# Patient Record
Sex: Male | Born: 1940 | Race: Black or African American | Hispanic: No | Smoking: Former smoker
Health system: Southern US, Community
[De-identification: ages and names within clinical notes are randomized; demographics above are authoritative.]

## PROBLEM LIST (undated history)

## (undated) DIAGNOSIS — I2699 Other pulmonary embolism without acute cor pulmonale: Secondary | ICD-10-CM

## (undated) DIAGNOSIS — J45909 Unspecified asthma, uncomplicated: Secondary | ICD-10-CM

## (undated) DIAGNOSIS — S62102A Fracture of unspecified carpal bone, left wrist, initial encounter for closed fracture: Secondary | ICD-10-CM

## (undated) DIAGNOSIS — J449 Chronic obstructive pulmonary disease, unspecified: Secondary | ICD-10-CM

## (undated) DIAGNOSIS — I219 Acute myocardial infarction, unspecified: Secondary | ICD-10-CM

## (undated) DIAGNOSIS — L0292 Furuncle, unspecified: Secondary | ICD-10-CM

## (undated) DIAGNOSIS — I10 Essential (primary) hypertension: Secondary | ICD-10-CM

## (undated) DIAGNOSIS — E119 Type 2 diabetes mellitus without complications: Secondary | ICD-10-CM

## (undated) HISTORY — PX: JOINT REPLACEMENT: SHX530

---

## 2012-11-14 DIAGNOSIS — F1190 Opioid use, unspecified, uncomplicated: Secondary | ICD-10-CM | POA: Insufficient documentation

## 2012-11-14 DIAGNOSIS — Z6841 Body Mass Index (BMI) 40.0 and over, adult: Secondary | ICD-10-CM | POA: Insufficient documentation

## 2012-11-14 DIAGNOSIS — I519 Heart disease, unspecified: Secondary | ICD-10-CM | POA: Insufficient documentation

## 2013-02-06 DIAGNOSIS — M79604 Pain in right leg: Secondary | ICD-10-CM | POA: Insufficient documentation

## 2013-03-21 DIAGNOSIS — D509 Iron deficiency anemia, unspecified: Secondary | ICD-10-CM | POA: Insufficient documentation

## 2014-04-03 DIAGNOSIS — I2699 Other pulmonary embolism without acute cor pulmonale: Secondary | ICD-10-CM

## 2014-04-03 DIAGNOSIS — I219 Acute myocardial infarction, unspecified: Secondary | ICD-10-CM

## 2014-04-03 HISTORY — DX: Other pulmonary embolism without acute cor pulmonale: I26.99

## 2014-04-03 HISTORY — DX: Acute myocardial infarction, unspecified: I21.9

## 2014-10-12 DIAGNOSIS — M5136 Other intervertebral disc degeneration, lumbar region: Secondary | ICD-10-CM | POA: Insufficient documentation

## 2014-10-12 DIAGNOSIS — M5416 Radiculopathy, lumbar region: Secondary | ICD-10-CM | POA: Insufficient documentation

## 2014-10-12 DIAGNOSIS — M4726 Other spondylosis with radiculopathy, lumbar region: Secondary | ICD-10-CM | POA: Insufficient documentation

## 2015-03-30 DIAGNOSIS — E119 Type 2 diabetes mellitus without complications: Secondary | ICD-10-CM | POA: Insufficient documentation

## 2015-04-04 DIAGNOSIS — S62102A Fracture of unspecified carpal bone, left wrist, initial encounter for closed fracture: Secondary | ICD-10-CM

## 2015-04-04 HISTORY — DX: Fracture of unspecified carpal bone, left wrist, initial encounter for closed fracture: S62.102A

## 2015-04-05 DIAGNOSIS — S52515A Nondisplaced fracture of left radial styloid process, initial encounter for closed fracture: Secondary | ICD-10-CM | POA: Insufficient documentation

## 2015-04-23 DIAGNOSIS — Z7901 Long term (current) use of anticoagulants: Secondary | ICD-10-CM | POA: Insufficient documentation

## 2015-05-10 DIAGNOSIS — R748 Abnormal levels of other serum enzymes: Secondary | ICD-10-CM | POA: Insufficient documentation

## 2015-05-10 DIAGNOSIS — R110 Nausea: Secondary | ICD-10-CM | POA: Insufficient documentation

## 2015-05-10 DIAGNOSIS — R1013 Epigastric pain: Secondary | ICD-10-CM | POA: Insufficient documentation

## 2015-05-10 DIAGNOSIS — R197 Diarrhea, unspecified: Secondary | ICD-10-CM | POA: Insufficient documentation

## 2015-05-10 DIAGNOSIS — Z86711 Personal history of pulmonary embolism: Secondary | ICD-10-CM | POA: Insufficient documentation

## 2015-05-10 DIAGNOSIS — E876 Hypokalemia: Secondary | ICD-10-CM | POA: Insufficient documentation

## 2015-05-10 DIAGNOSIS — I10 Essential (primary) hypertension: Secondary | ICD-10-CM | POA: Insufficient documentation

## 2015-05-16 DIAGNOSIS — G894 Chronic pain syndrome: Secondary | ICD-10-CM | POA: Insufficient documentation

## 2015-05-27 DIAGNOSIS — S52502D Unspecified fracture of the lower end of left radius, subsequent encounter for closed fracture with routine healing: Secondary | ICD-10-CM | POA: Insufficient documentation

## 2015-05-27 DIAGNOSIS — M19042 Primary osteoarthritis, left hand: Secondary | ICD-10-CM | POA: Insufficient documentation

## 2015-06-20 DIAGNOSIS — G4700 Insomnia, unspecified: Secondary | ICD-10-CM | POA: Insufficient documentation

## 2015-09-18 ENCOUNTER — Emergency Department (HOSPITAL_BASED_OUTPATIENT_CLINIC_OR_DEPARTMENT_OTHER)
Admission: EM | Admit: 2015-09-18 | Discharge: 2015-09-18 | Disposition: A | Discharge status: Home / Self Care | Payer: Medicare Other | Attending: Emergency Medicine | Admitting: Emergency Medicine

## 2015-09-18 ENCOUNTER — Emergency Department (HOSPITAL_BASED_OUTPATIENT_CLINIC_OR_DEPARTMENT_OTHER): Payer: Medicare Other

## 2015-09-18 ENCOUNTER — Encounter (HOSPITAL_BASED_OUTPATIENT_CLINIC_OR_DEPARTMENT_OTHER): Payer: Self-pay | Admitting: *Deleted

## 2015-09-18 DIAGNOSIS — S63619A Unspecified sprain of unspecified finger, initial encounter: Secondary | ICD-10-CM | POA: Insufficient documentation

## 2015-09-18 DIAGNOSIS — E119 Type 2 diabetes mellitus without complications: Secondary | ICD-10-CM | POA: Insufficient documentation

## 2015-09-18 DIAGNOSIS — S60032A Contusion of left middle finger without damage to nail, initial encounter: Secondary | ICD-10-CM | POA: Insufficient documentation

## 2015-09-18 DIAGNOSIS — J449 Chronic obstructive pulmonary disease, unspecified: Secondary | ICD-10-CM | POA: Diagnosis not present

## 2015-09-18 DIAGNOSIS — Z87891 Personal history of nicotine dependence: Secondary | ICD-10-CM | POA: Insufficient documentation

## 2015-09-18 DIAGNOSIS — W1830XA Fall on same level, unspecified, initial encounter: Secondary | ICD-10-CM | POA: Insufficient documentation

## 2015-09-18 DIAGNOSIS — T07XXXA Unspecified multiple injuries, initial encounter: Secondary | ICD-10-CM

## 2015-09-18 DIAGNOSIS — Y92009 Unspecified place in unspecified non-institutional (private) residence as the place of occurrence of the external cause: Secondary | ICD-10-CM | POA: Diagnosis not present

## 2015-09-18 DIAGNOSIS — I10 Essential (primary) hypertension: Secondary | ICD-10-CM | POA: Diagnosis not present

## 2015-09-18 DIAGNOSIS — I252 Old myocardial infarction: Secondary | ICD-10-CM | POA: Diagnosis not present

## 2015-09-18 DIAGNOSIS — S62501A Fracture of unspecified phalanx of right thumb, initial encounter for closed fracture: Secondary | ICD-10-CM

## 2015-09-18 DIAGNOSIS — Y999 Unspecified external cause status: Secondary | ICD-10-CM | POA: Diagnosis not present

## 2015-09-18 DIAGNOSIS — S7002XA Contusion of left hip, initial encounter: Secondary | ICD-10-CM | POA: Diagnosis not present

## 2015-09-18 DIAGNOSIS — S62524A Nondisplaced fracture of distal phalanx of right thumb, initial encounter for closed fracture: Secondary | ICD-10-CM | POA: Diagnosis not present

## 2015-09-18 DIAGNOSIS — Y9301 Activity, walking, marching and hiking: Secondary | ICD-10-CM | POA: Diagnosis not present

## 2015-09-18 DIAGNOSIS — S0990XA Unspecified injury of head, initial encounter: Secondary | ICD-10-CM

## 2015-09-18 HISTORY — DX: Unspecified asthma, uncomplicated: J45.909

## 2015-09-18 HISTORY — DX: Fracture of unspecified carpal bone, left wrist, initial encounter for closed fracture: S62.102A

## 2015-09-18 HISTORY — DX: Type 2 diabetes mellitus without complications: E11.9

## 2015-09-18 HISTORY — DX: Furuncle, unspecified: L02.92

## 2015-09-18 HISTORY — DX: Essential (primary) hypertension: I10

## 2015-09-18 HISTORY — DX: Other pulmonary embolism without acute cor pulmonale: I26.99

## 2015-09-18 HISTORY — DX: Chronic obstructive pulmonary disease, unspecified: J44.9

## 2015-09-18 HISTORY — DX: Acute myocardial infarction, unspecified: I21.9

## 2015-09-18 LAB — CBC WITH DIFFERENTIAL/PLATELET
BASOS ABS: 0 10*3/uL (ref 0.0–0.1)
BASOS PCT: 1 %
EOS ABS: 0.1 10*3/uL (ref 0.0–0.7)
EOS PCT: 2 %
HCT: 37.9 % — ABNORMAL LOW (ref 39.0–52.0)
Hemoglobin: 12.7 g/dL — ABNORMAL LOW (ref 13.0–17.0)
Lymphocytes Relative: 27 %
Lymphs Abs: 2 10*3/uL (ref 0.7–4.0)
MCH: 29 pg (ref 26.0–34.0)
MCHC: 33.5 g/dL (ref 30.0–36.0)
MCV: 86.5 fL (ref 78.0–100.0)
MONO ABS: 0.9 10*3/uL (ref 0.1–1.0)
Monocytes Relative: 12 %
Neutro Abs: 4.5 10*3/uL (ref 1.7–7.7)
Neutrophils Relative %: 58 %
PLATELETS: 242 10*3/uL (ref 150–400)
RBC: 4.38 MIL/uL (ref 4.22–5.81)
RDW: 14.8 % (ref 11.5–15.5)
WBC: 7.7 10*3/uL (ref 4.0–10.5)

## 2015-09-18 LAB — BASIC METABOLIC PANEL
Anion gap: 8 (ref 5–15)
BUN: 20 mg/dL (ref 6–20)
CALCIUM: 8.8 mg/dL — AB (ref 8.9–10.3)
CO2: 25 mmol/L (ref 22–32)
CREATININE: 1.21 mg/dL (ref 0.61–1.24)
Chloride: 103 mmol/L (ref 101–111)
GFR calc Af Amer: >60 mL/min (ref >60)
GFR, EST NON AFRICAN AMERICAN: 56 mL/min — AB (ref >60)
GLUCOSE: 175 mg/dL — AB (ref 65–99)
POTASSIUM: 3.6 mmol/L (ref 3.5–5.1)
SODIUM: 136 mmol/L (ref 135–145)

## 2015-09-18 LAB — PROTIME-INR
INR: 2.18 — AB (ref 0.00–1.49)
PROTHROMBIN TIME: 24.1 s — AB (ref 11.6–15.2)

## 2015-09-18 MED ORDER — OXYCODONE-ACETAMINOPHEN 5-325 MG PO TABS: 1.0000 | ORAL_TABLET | ORAL | Status: DC | PRN

## 2015-09-18 NOTE — Discharge Instructions (Signed)
Thumb Fracture A thumb fracture is a break in one of the two bones of your thumb. The thumb bone that goes from the tip of your thumb to the first joint in your thumb is called the distal phalanx. The thumb bone that goes from the first joint to the joint at the base of your thumb is called the proximal phalanx. Breaks that occur at the joints of your thumb are harder to treat. A broken thumb is more serious than a break in one of your other fingers because you need your thumb for grasping. Thumb fractures are also more likely to lead to pain and stiffness years after healing (arthritis). CAUSES Thumb fractures may be caused by:  A direct blow to your thumb.  Stress on your thumb from it being pulled out of place. These types of injuries often happen as a result of:  Car accidents.  Bicycle accidents.  Falling with your hand outstretched.  Participating in sports such as wrestling, hockey, football, or skiing. RISK FACTORS You may be more likely to break your thumb if you have a condition that causes your bones to become thin and brittle (osteoporosis). SIGNS AND SYMPTOMS The most common symptom is severe pain at the fracture site. Other signs and symptoms may include:  Swelling.  Bruising.  Not being able to move the thumb.  An abnormal shape of the thumb (deformity).  Numbness or coldness.  A red, black, or blue thumbnail. DIAGNOSIS Your health care provider may suspect a thumb fracture if you recently injured your thumb and have signs and symptoms of a fracture. An X-ray of your thumb may be done to confirm the diagnosis and determine how bad the break is. TREATMENT A thumb fracture should be treated as soon as possible. You may need to wear a padded splint to keep your thumb from moving and to protect your thumb until you can get a cast or have surgery. Treatment options include:  Immobilization.  A cast or splint is put on the injured area without changing the position  of the broken bone.  You may have to wear a type of cast called a spica cast or hitchhiker cast to hold the thumb in the proper position.  A cast is usually left on for 4-6 weeks.  Closed reduction.  In this procedure, the bones are put back into position without surgery.  Open reduction and internal fixation (ORIF). This is a surgical procedure.  First, the fracture site is opened up.  Then, the bone pieces are fixed into place with metal screws, plates, or wires.  External fixation.  In this type of open reduction, the fracture is held in place by metal pins.  The pins are attached to a stabilizing bar outside your skin.  You may need to wear a cast after surgery for up to 6 weeks.  You may need to return for X-rays to make sure your thumb is healing properly.  After your cast is taken off, you may need to do hand exercises (physical therapy) to get movement back in your thumb.  It may take another 3 months to regain complete use of your thumb. HOME CARE INSTRUCTIONS  Take medicines only as directed by your health care provider.  Keep your hand elevated above the level of your heart when resting.  Keep your cast dry when bathing. Cover it with a plastic bag as directed by your health care provider.  After your cast is removed, exercise your thumb at home.  Your health care provider may suggest that you:  Move your thumb in circles.  Touch your thumb to your little finger.  Do these exercises several times a day.  Ask your health care provider whether you can use a hand exerciser to strengthen your muscles.  If your thumb feels stiff while you are exercising it, try doing the exercises while soaking your hand in warm water.  Keep all follow-up visits as directed by your health care provider. This is important. SEEK MEDICAL CARE IF:  You have more than a small spot of bleeding from under your cast or splint.  Your pain medicine is not helping.  You have a  fever.  You have numbness or tingling in the injured area.  Your cast becomes loose or damaged.  You notice a bad odor or discharge coming from under your cast. SEEK IMMEDIATE MEDICAL CARE IF:  You have pain that is very bad or getting worse.  You lose feeling in your thumb.  Your thumb turns pale or blue.  Your thumb feels cold.  You have drainage, redness, or swelling at the injury site. MAKE SURE YOU:  Understand these instructions.  Will watch your condition.  Will get help right away if you are not doing well or get worse.   This information is not intended to replace advice given to you by your health care provider. Make sure you discuss any questions you have with your health care provider.   Document Released: 12/17/2002 Document Revised: 12/09/2014 Document Reviewed: 05/23/2013 Elsevier Interactive Patient Education 2016 [place] Injury, Adult You have a head injury. Headaches and throwing up (vomiting) are common after a head injury. It should be easy to wake up from sleeping. Sometimes you must stay in the hospital. Most problems happen within the first 24 hours. Side effects may occur up to 7-10 days after the injury.  WHAT ARE THE TYPES OF HEAD INJURIES? Head injuries can be as minor as a bump. Some head injuries can be more severe. More severe head injuries include:  A jarring injury to the brain (concussion).  A bruise of the brain (contusion). This mean there is bleeding in the brain that can cause swelling.  A cracked skull (skull fracture).  Bleeding in the brain that collects, clots, and forms a bump (hematoma). WHEN SHOULD I GET HELP RIGHT AWAY?   You are confused or sleepy.  You cannot be woken up.  You feel sick to your stomach (nauseous) or keep throwing up (vomiting).  Your dizziness or unsteadiness is getting worse.  You have very bad, lasting headaches that are not helped by medicine. Take medicines only as told by your  doctor.  You cannot use your arms or legs like normal.  You cannot walk.  You notice changes in the black spots in the center of the colored part of your eye (pupil).  You have clear or bloody fluid coming from your nose or ears.  You have trouble seeing. During the next 24 hours after the injury, you must stay with someone who can watch you. This person should get help right away (call 911 in the [place] if you start to shake and are not able to control it (have seizures), you pass out, or you are unable to wake up. HOW CAN I PREVENT A HEAD INJURY IN THE FUTURE?  Wear seat belts.  Wear a helmet while bike riding and playing sports like football.  Stay away from dangerous activities around the  house. WHEN CAN I RETURN TO NORMAL ACTIVITIES AND ATHLETICS? See your doctor before doing these activities. You should not do normal activities or play contact sports until 1 week after the following symptoms have stopped:  Headache that does not go away.  Dizziness.  Poor attention.  Confusion.  Memory problems.  Sickness to your stomach or throwing up.  Tiredness.  Fussiness.  Bothered by bright lights or loud noises.  Anxiousness or depression.  Restless sleep. MAKE SURE YOU:   Understand these instructions.  Will watch your condition.  Will get help right away if you are not doing well or get worse.   This information is not intended to replace advice given to you by your health care provider. Make sure you discuss any questions you have with your health care provider.   Document Released: 03/02/2008 Document Revised: 04/10/2014 Document Reviewed: 11/25/2012 Elsevier Interactive Patient Education 2016 [employer]  Finger Sprain A finger sprain is a tear in one of the strong, fibrous tissues that connect the bones (ligaments) in your finger. The severity of the sprain depends on how much of the ligament is torn. The tear can be either partial or complete. CAUSES   Often, sprains are a result of a fall or accident. If you extend your hands to catch an object or to protect yourself, the force of the impact causes the fibers of your ligament to stretch too much. This excess tension causes the fibers of your ligament to tear. SYMPTOMS  You may have some loss of motion in your finger. Other symptoms include:  Bruising.  Tenderness.  Swelling. DIAGNOSIS  In order to diagnose finger sprain, your caregiver will physically examine your finger or thumb to determine how torn the ligament is. Your caregiver may also suggest an X-ray exam of your finger to make sure no bones are broken. TREATMENT  If your ligament is only partially torn, treatment usually involves keeping the finger in a fixed position (immobilization) for a short period. To do this, your caregiver will apply a bandage, cast, or splint to keep your finger from moving until it heals. For a partially torn ligament, the healing process usually takes 2 to 3 weeks. If your ligament is completely torn, you may need surgery to reconnect the ligament to the bone. After surgery a cast or splint will be applied and will need to stay on your finger or thumb for 4 to 6 weeks while your ligament heals. HOME CARE INSTRUCTIONS  Keep your injured finger elevated, when possible, to decrease swelling.  To ease pain and swelling, apply ice to your joint twice a day, for 2 to 3 days:  Put ice in a plastic bag.  Place a towel between your skin and the bag.  Leave the ice on for 15 minutes.  Only take over-the-counter or prescription medicine for pain as directed by your caregiver.  Do not wear rings on your injured finger.  Do not leave your finger unprotected until pain and stiffness go away (usually 3 to 4 weeks).  Do not allow your cast or splint to get wet. Cover your cast or splint with a plastic bag when you shower or bathe. Do not swim.  Your caregiver may suggest special exercises for you to do  during your recovery to prevent or limit permanent stiffness. SEEK IMMEDIATE MEDICAL CARE IF:  Your cast or splint becomes damaged.  Your pain becomes worse rather than better. MAKE SURE YOU:  Understand these instructions.  Will watch  your condition.  Will get help right away if you are not doing well or get worse.   This information is not intended to replace advice given to you by your health care provider. Make sure you discuss any questions you have with your health care provider.   Document Released: 04/27/2004 Document Revised: 04/10/2014 Document Reviewed: 11/21/2010 Elsevier Interactive Patient Education [employer].

## 2015-09-18 NOTE — ED Provider Notes (Signed)
CSN: [id number]     Arrival date & time 09/18/15  [id number] History   First MD Initiated Contact with Patient 09/18/15 [phone]     Chief Complaint  Patient presents with  . Fall     (Consider location/radiation/quality/duration/timing/severity/associated sxs/prior Treatment) HPI Comments: Patient history diabetes, hypertension, COPD and prior PE on Coumadin presents after a fall. He states about 10-11 last night he was walking in the hallway of his apartment in his right knee gave out. He states from time to time his knee gives out and when it gave out last night caused him to fall. He fell over onto his left hip and braced himself with his hands. He did hit his head on the wall. There is no loss of consciousness. He complains of pain to his low back although he has chronic pain to his low back. He also has pain to his left hip and both of his hands. He denies any nausea or vomiting. He has chronic pain to right leg which is unchanged from baseline. 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 He denies any recent illnesses. He denies any dizziness chest pain shortness of breath or other symptoms preceding the fall.  Patient is a 75 y.o. male presenting with fall.  Fall Pertinent negatives include no chest pain, no abdominal pain, no headaches and no shortness of breath.    Past Medical History  Diagnosis Date  . Pulmonary embolism ([place]) 2016  . Diabetes mellitus without complication ([place])   . Hypertension   . COPD (chronic obstructive pulmonary disease) ([place])   . Asthma   . Boils   . Myocardial infarction ([place]) 2016  . Wrist fracture, left 2017   Past Surgical History  Procedure Laterality Date  . Joint replacement     No family history on file. Social History  Substance Use Topics  . Smoking status: Former Research scientist (life sciences)  . Smokeless tobacco: Never Used  . Alcohol Use: 1.2 oz/week    2 Cans of beer per week    Review of Systems  Constitutional: Negative for fever, chills, diaphoresis and  fatigue.  HENT: Negative for congestion, rhinorrhea and sneezing.   Eyes: Negative.   Respiratory: Negative for cough, chest tightness and shortness of breath.   Cardiovascular: Negative for chest pain and leg swelling.  Gastrointestinal: Negative for nausea, vomiting, abdominal pain, diarrhea and blood in stool.  Genitourinary: Negative for frequency, hematuria, flank pain and difficulty urinating.  Musculoskeletal: Positive for back pain and arthralgias.  Skin: Negative for rash and wound.  Neurological: Negative for dizziness, speech difficulty, weakness, numbness and headaches.      Allergies  Adhesive  Home Medications   Prior to Admission medications   Medication Sig Start Date End Date Taking? Authorizing Provider  oxyCODONE-acetaminophen (PERCOCET) 5-325 MG tablet Take 1-2 tablets by mouth every 4 (four) hours as needed. 09/18/15   Malvin Johns, MD   BP 152/80 mmHg  Pulse 99  Temp(Src) 98.6 F (37 C) (Oral)  Resp 20  Ht 5\' 9"  (1.753 m)  Wt 250 lb (113.399 kg)  BMI 36.90 kg/m2  SpO2 98% Physical Exam  Constitutional: He is oriented to person, place, and time. He appears well-developed and well-nourished.  HENT:  Head: Normocephalic and atraumatic.  Eyes: Pupils are equal, round, and reactive to light.  Neck:  No tenderness to the cervical or thoracic spine. There is tenderness to the lower lumbar spine. No step-offs or deformities are noted.  Cardiovascular: Normal rate, regular rhythm and normal heart sounds.  Pulmonary/Chest: Effort normal and breath sounds normal. No respiratory distress. He has no wheezes. He has no rales. He exhibits no tenderness.  Abdominal: Soft. Bowel sounds are normal. There is no tenderness. There is no rebound and no guarding.  Musculoskeletal: Normal range of motion. He exhibits no edema.  Patient has pain on range of motion the left hip. There is no noticeable deformity to the leg. He has prior bilateral midline scars over the anterior  knees. He has no pain to the knee. There is some tenderness to the mid left tibia. There some mild swelling to this area. He reports that this is from an injury about a month ago. There is some generalized tenderness to the right leg but he states this is chronic and unchanged from his baseline. Pedal pulses are faint but noted by Doppler bilaterally. There is no color change or indication of acute arterial compromise. He has pain to his left hand, primarily over the third MCP joint with swelling to this area. There is also tenderness to the radial side of the left wrist. There is pain to the right thumb over the proximal phalanx. There is no other pain to the remainder of the hand or wrist. No pain to the shoulders or elbows. No wounds are noted.  Lymphadenopathy:    He has no cervical adenopathy.  Neurological: He is alert and oriented to person, place, and time.  Skin: Skin is warm and dry. No rash noted.  Psychiatric: He has a normal mood and affect.    ED Course  Procedures (including critical care time) Labs Review Labs Reviewed  PROTIME-INR - Abnormal; Notable for the following:    Prothrombin Time 24.1 (*)    INR 2.18 (*)    All other components within normal limits  BASIC METABOLIC PANEL - Abnormal; Notable for the following:    Glucose, Bld 175 (*)    Calcium 8.8 (*)    GFR calc non Af Amer 56 (*)    All other components within normal limits  CBC WITH DIFFERENTIAL/PLATELET - Abnormal; Notable for the following:    Hemoglobin 12.7 (*)    HCT 37.9 (*)    All other components within normal limits   Results for orders placed or performed during the hospital encounter of 09/18/15  Protime-INR  Result Value Ref Range   Prothrombin Time 24.1 (H) 11.6 - 15.2 seconds   INR 2.18 (H) 0.00 - 99991111  Basic metabolic panel  Result Value Ref Range   Sodium 136 135 - 145 mmol/L   Potassium 3.6 3.5 - 5.1 mmol/L   Chloride 103 101 - 111 mmol/L   CO2 25 22 - 32 mmol/L   Glucose, Bld 175 (H)  65 - 99 mg/dL   BUN 20 6 - 20 mg/dL   Creatinine, Ser 1.21 0.61 - 1.24 mg/dL   Calcium 8.8 (L) 8.9 - 10.3 mg/dL   GFR calc non Af Amer 56 (L) >60 mL/min   GFR calc Af Amer >60 >60 mL/min   Anion gap 8 5 - 15  CBC with Differential  Result Value Ref Range   WBC 7.7 4.0 - 10.5 K/uL   RBC 4.38 4.22 - 5.81 MIL/uL   Hemoglobin 12.7 (L) 13.0 - 17.0 g/dL   HCT 37.9 (L) 39.0 - 52.0 %   MCV 86.5 78.0 - 100.0 fL   MCH 29.0 26.0 - 34.0 pg   MCHC 33.5 30.0 - 36.0 g/dL   RDW 14.8 11.5 - 15.5 %  Platelets 242 150 - 400 K/uL   Neutrophils Relative % 58 %   Neutro Abs 4.5 1.7 - 7.7 K/uL   Lymphocytes Relative 27 %   Lymphs Abs 2.0 0.7 - 4.0 K/uL   Monocytes Relative 12 %   Monocytes Absolute 0.9 0.1 - 1.0 K/uL   Eosinophils Relative 2 %   Eosinophils Absolute 0.1 0.0 - 0.7 K/uL   Basophils Relative 1 %   Basophils Absolute 0.0 0.0 - 0.1 K/uL   Dg Lumbar Spine Complete  09/18/2015  CLINICAL DATA:  Fall.  Low back pain. EXAM: LUMBAR SPINE - COMPLETE 4+ VIEW COMPARISON:  None. FINDINGS: This report assumes 5 non rib-bearing lumbar vertebrae. Lumbar vertebral body heights are preserved, with no fracture. Moderate spondylosis throughout the lumbar spine with loss of disc height at L5-S1. No spondylolisthesis. Facet arthropathy bilaterally in the lower lumbar spine. No aggressive appearing focal osseous lesions. IMPRESSION: 1. No lumbar spine fracture or spondylolisthesis . 2. Moderate spondylosis. Electronically Signed   By: Ilona Sorrel M.D.   On: 09/18/2015 09:44   Dg Wrist Complete Left  09/18/2015  CLINICAL DATA:  Fall.  Left wrist pain. EXAM: LEFT WRIST - COMPLETE 3+ VIEW COMPARISON:  None. FINDINGS: No fracture, dislocation or suspicious focal osseous lesion. Mild-to-moderate osteoarthritis at the radiocarpal joint. Severe osteoarthritis at the first carpometacarpal joint. Diffuse left wrist soft tissue swelling. IMPRESSION: No fracture or dislocation. Diffuse left wrist soft tissue swelling with  advanced osteoarthritis as described. Electronically Signed   By: Ilona Sorrel M.D.   On: 09/18/2015 09:35   Dg Tibia/fibula Left  09/18/2015  CLINICAL DATA:  Fall.  Left lower extremity pain. EXAM: LEFT TIBIA AND FIBULA - 2 VIEW COMPARISON:  None. FINDINGS: Intra medullary rod in the distal left femur traverses the left knee joint and extends into the distal left tibial shaft with distal interlocking screw. No evidence of hardware fracture or loosening. Ankylosis of the left knee joint. No osseous fracture or suspicious focal osseous lesion. No evidence of malalignment at the left ankle on the provided views. IMPRESSION: No osseous fracture. 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 Intramedullary rod traverses the left femoral and left tibial shafts, with no evidence of hardware complication. Electronically Signed   By: Ilona Sorrel M.D.   On: 09/18/2015 09:39   Ct Head Wo Contrast  09/18/2015  CLINICAL DATA:  Fall at home last night.  No loss of consciousness. EXAM: CT HEAD WITHOUT CONTRAST CT CERVICAL SPINE WITHOUT CONTRAST TECHNIQUE: Multidetector CT imaging of the head and cervical spine was performed following the standard protocol without intravenous contrast. Multiplanar CT image reconstructions of the cervical spine were also generated. COMPARISON:  None. FINDINGS: CT HEAD FINDINGS Bony calvarium appears intact. Mild diffuse cortical atrophy is noted. Minimal chronic ischemic white matter disease is noted. No mass effect or midline shift is noted. Ventricular size is within normal limits. There is no evidence of mass lesion, hemorrhage or acute infarction. CT CERVICAL SPINE FINDINGS No fracture or spondylolisthesis is noted. Moderate degenerative disc disease is noted at C2-3, C3-4 and C4-5 with anterior osteophyte formation. Mild degenerative disc disease is noted at C5-6. Severe degenerative disc disease is noted at C6-7. Degenerative changes seen involving the right posterior facet joint at C4-5.  Visualized lung fields are unremarkable. IMPRESSION: Mild diffuse cortical atrophy. Mild chronic ischemic white matter disease. No acute intracranial abnormality seen. 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 No fracture or spondylolisthesis is noted. Electronically Signed  By: Marijo Conception, M.D.   On: 09/18/2015 08:43   Ct Cervical Spine Wo Contrast  09/18/2015  CLINICAL DATA:  Fall at home last night.  No loss of consciousness. EXAM: CT HEAD WITHOUT CONTRAST CT CERVICAL SPINE WITHOUT CONTRAST TECHNIQUE: Multidetector CT imaging of the head and cervical spine was performed following the standard protocol without intravenous contrast. Multiplanar CT image reconstructions of the cervical spine were also generated. COMPARISON:  None. FINDINGS: CT HEAD FINDINGS Bony calvarium appears intact. Mild diffuse cortical atrophy is noted. Minimal chronic ischemic white matter disease is noted. No mass effect or midline shift is noted. Ventricular size is within normal limits. There is no evidence of mass lesion, hemorrhage or acute infarction. CT CERVICAL SPINE FINDINGS No fracture or spondylolisthesis is noted. Moderate degenerative disc disease is noted at C2-3, C3-4 and C4-5 with anterior osteophyte formation. Mild degenerative disc disease is noted at C5-6. Severe degenerative disc disease is noted at C6-7. Degenerative changes seen involving the right posterior facet joint at C4-5. Visualized lung fields are unremarkable. IMPRESSION: Mild diffuse cortical atrophy. Mild chronic ischemic white matter disease. No acute intracranial abnormality seen. 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 No fracture or spondylolisthesis is noted. Electronically Signed   By: Marijo Conception, M.D.   On: 09/18/2015 08:43   Dg Hand Complete Left  09/18/2015  CLINICAL DATA:  Acute left hand pain after fall last night. EXAM: LEFT HAND - COMPLETE 3+ VIEW COMPARISON:  None.  FINDINGS: There is no evidence of fracture or dislocation. Narrowing and osteophyte formation is seen involving the first carpometacarpal joint. Narrowing of the radiocarpal joint is noted as well. Soft tissues are unremarkable. IMPRESSION: Osteoarthritis of the first carpometacarpal joint. No acute abnormality seen in the left hand. Electronically Signed   By: Marijo Conception, M.D.   On: 09/18/2015 09:35   Dg Finger Thumb Right  09/18/2015  CLINICAL DATA:  Fall.  Right thumb pain. EXAM: RIGHT THUMB 2+V COMPARISON:  None. FINDINGS: Intra-articular nondisplaced volar plate fracture at the distal phalangeal side of the interphalangeal joint in the right thumb. No additional fracture. No dislocation. No suspicious focal osseous lesion. Polyarticular mild-to-moderate osteoarthritis, most prominent at the first carpometacarpal joint. IMPRESSION: Nondisplaced intra-articular volar plate fracture at the distal phalangeal side of the interphalangeal joint in the right thumb. Electronically Signed   By: Ilona Sorrel M.D.   On: 09/18/2015 09:41   Dg Hip Unilat With Pelvis 2-3 Views Left  09/18/2015  CLINICAL DATA:  Fall.  Left hip pain. EXAM: DG HIP (WITH OR WITHOUT PELVIS) 2-3V LEFT COMPARISON:  None. FINDINGS: No pelvic fracture or diastasis. No left hip fracture or dislocation. Partially visualized intra medullary rod in the left proximal femur with interlocking proximal screw and prominent heterotopic calcification at the left greater trochanter. No evidence of hardware fracture or loosening. Bilateral hip osteoarthritis, moderate on the right and mild on the left. No suspicious focal osseous lesion. Degenerative changes in the visualized lower lumbar spine. IMPRESSION: No left hip fracture or dislocation. Partially visualized hardware in the left proximal femur, with no hardware complication. Heterotopic calcification at the left greater trochanter. Bilateral hip osteoarthritis. Electronically Signed   By: Ilona Sorrel M.D.   On: 09/18/2015 09:37      Imaging Review Dg Lumbar Spine Complete  09/18/2015  CLINICAL DATA:  Fall.  Low back pain. EXAM: LUMBAR SPINE - COMPLETE 4+ VIEW COMPARISON:  None. FINDINGS: This report assumes 5 non  rib-bearing lumbar vertebrae. Lumbar vertebral body heights are preserved, with no fracture. Moderate spondylosis throughout the lumbar spine with loss of disc height at L5-S1. No spondylolisthesis. Facet arthropathy bilaterally in the lower lumbar spine. No aggressive appearing focal osseous lesions. IMPRESSION: 1. No lumbar spine fracture or spondylolisthesis . 2. Moderate spondylosis. Electronically Signed   By: Ilona Sorrel M.D.   On: 09/18/2015 09:44   Dg Wrist Complete Left  09/18/2015  CLINICAL DATA:  Fall.  Left wrist pain. EXAM: LEFT WRIST - COMPLETE 3+ VIEW COMPARISON:  None. FINDINGS: No fracture, dislocation or suspicious focal osseous lesion. Mild-to-moderate osteoarthritis at the radiocarpal joint. Severe osteoarthritis at the first carpometacarpal joint. Diffuse left wrist soft tissue swelling. IMPRESSION: No fracture or dislocation. Diffuse left wrist soft tissue swelling with advanced osteoarthritis as described. Electronically Signed   By: Ilona Sorrel M.D.   On: 09/18/2015 09:35   Dg Tibia/fibula Left  09/18/2015  CLINICAL DATA:  Fall.  Left lower extremity pain. EXAM: LEFT TIBIA AND FIBULA - 2 VIEW COMPARISON:  None. FINDINGS: Intra medullary rod in the distal left femur traverses the left knee joint and extends into the distal left tibial shaft with distal interlocking screw. No evidence of hardware fracture or loosening. Ankylosis of the left knee joint. No osseous fracture or suspicious focal osseous lesion. No evidence of malalignment at the left ankle on the provided views. IMPRESSION: No osseous fracture. 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 Intramedullary rod traverses the left femoral and left tibial shafts, with no evidence of hardware complication.  Electronically Signed   By: Ilona Sorrel M.D.   On: 09/18/2015 09:39   Ct Head Wo Contrast  09/18/2015  CLINICAL DATA:  Fall at home last night.  No loss of consciousness. EXAM: CT HEAD WITHOUT CONTRAST CT CERVICAL SPINE WITHOUT CONTRAST TECHNIQUE: Multidetector CT imaging of the head and cervical spine was performed following the standard protocol without intravenous contrast. Multiplanar CT image reconstructions of the cervical spine were also generated. COMPARISON:  None. FINDINGS: CT HEAD FINDINGS Bony calvarium appears intact. Mild diffuse cortical atrophy is noted. Minimal chronic ischemic white matter disease is noted. No mass effect or midline shift is noted. Ventricular size is within normal limits. There is no evidence of mass lesion, hemorrhage or acute infarction. CT CERVICAL SPINE FINDINGS No fracture or spondylolisthesis is noted. Moderate degenerative disc disease is noted at C2-3, C3-4 and C4-5 with anterior osteophyte formation. Mild degenerative disc disease is noted at C5-6. Severe degenerative disc disease is noted at C6-7. Degenerative changes seen involving the right posterior facet joint at C4-5. Visualized lung fields are unremarkable. IMPRESSION: Mild diffuse cortical atrophy. Mild chronic ischemic white matter disease. No acute intracranial abnormality seen. 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 No fracture or spondylolisthesis is noted. Electronically Signed   By: Marijo Conception, M.D.   On: 09/18/2015 08:43   Ct Cervical Spine Wo Contrast  09/18/2015  CLINICAL DATA:  Fall at home last night.  No loss of consciousness. EXAM: CT HEAD WITHOUT CONTRAST CT CERVICAL SPINE WITHOUT CONTRAST TECHNIQUE: Multidetector CT imaging of the head and cervical spine was performed following the standard protocol without intravenous contrast. Multiplanar CT image reconstructions of the cervical spine were also generated. COMPARISON:  None. FINDINGS: CT HEAD FINDINGS Bony  calvarium appears intact. Mild diffuse cortical atrophy is noted. Minimal chronic ischemic white matter disease is noted. No mass effect or midline shift is noted. Ventricular size is within normal limits.  There is no evidence of mass lesion, hemorrhage or acute infarction. CT CERVICAL SPINE FINDINGS No fracture or spondylolisthesis is noted. Moderate degenerative disc disease is noted at C2-3, C3-4 and C4-5 with anterior osteophyte formation. Mild degenerative disc disease is noted at C5-6. Severe degenerative disc disease is noted at C6-7. Degenerative changes seen involving the right posterior facet joint at C4-5. Visualized lung fields are unremarkable. IMPRESSION: Mild diffuse cortical atrophy. Mild chronic ischemic white matter disease. No acute intracranial abnormality seen. 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 No fracture or spondylolisthesis is noted. Electronically Signed   By: Marijo Conception, M.D.   On: 09/18/2015 08:43   Dg Hand Complete Left  09/18/2015  CLINICAL DATA:  Acute left hand pain after fall last night. EXAM: LEFT HAND - COMPLETE 3+ VIEW COMPARISON:  None. FINDINGS: There is no evidence of fracture or dislocation. Narrowing and osteophyte formation is seen involving the first carpometacarpal joint. Narrowing of the radiocarpal joint is noted as well. Soft tissues are unremarkable. IMPRESSION: Osteoarthritis of the first carpometacarpal joint. No acute abnormality seen in the left hand. Electronically Signed   By: Marijo Conception, M.D.   On: 09/18/2015 09:35   Dg Finger Thumb Right  09/18/2015  CLINICAL DATA:  Fall.  Right thumb pain. EXAM: RIGHT THUMB 2+V COMPARISON:  None. FINDINGS: Intra-articular nondisplaced volar plate fracture at the distal phalangeal side of the interphalangeal joint in the right thumb. No additional fracture. No dislocation. No suspicious focal osseous lesion. Polyarticular mild-to-moderate osteoarthritis, most prominent at the  first carpometacarpal joint. IMPRESSION: Nondisplaced intra-articular volar plate fracture at the distal phalangeal side of the interphalangeal joint in the right thumb. Electronically Signed   By: Ilona Sorrel M.D.   On: 09/18/2015 09:41   Dg Hip Unilat With Pelvis 2-3 Views Left  09/18/2015  CLINICAL DATA:  Fall.  Left hip pain. EXAM: DG HIP (WITH OR WITHOUT PELVIS) 2-3V LEFT COMPARISON:  None. FINDINGS: No pelvic fracture or diastasis. No left hip fracture or dislocation. Partially visualized intra medullary rod in the left proximal femur with interlocking proximal screw and prominent heterotopic calcification at the left greater trochanter. No evidence of hardware fracture or loosening. Bilateral hip osteoarthritis, moderate on the right and mild on the left. No suspicious focal osseous lesion. Degenerative changes in the visualized lower lumbar spine. IMPRESSION: No left hip fracture or dislocation. Partially visualized hardware in the left proximal femur, with no hardware complication. Heterotopic calcification at the left greater trochanter. Bilateral hip osteoarthritis. Electronically Signed   By: Ilona Sorrel M.D.   On: 09/18/2015 09:37   I have personally reviewed and evaluated these images and lab results as part of my medical decision-making.   EKG Interpretation None      MDM   Final diagnoses:  Thumb fracture, right, closed, initial encounter  Finger sprain, initial encounter  Head injury, initial encounter  Multiple contusions    There is no evidence of intracranial hemorrhage. No spinal fractures are noted. He does have a nondisplaced fracture of the right thumb. There is no fracture of the left hand. No other fractures are noted. A finger splint was placed on the right thumb. His left middle and second finger were buddy taped for support. He is able to ambulate without significant difficulty. His hip x-rays were negative for fracture. Given his ability to weight-bear, I have a  low suspicion of an occult fracture. He was discharged home in good condition. He was encouraged  to follow-up with his PCP for recheck. He was given a prescription for Percocet for pain. He's previously been on OxyContin up to 15 mg tablets. He states he hasn't had them in about 2 months. His PCP had mentioned referring him to a pain clinic. He's currently not taking anything for pain at home.    Malvin Johns, MD 09/18/15 1052

## 2015-09-18 NOTE — ED Notes (Signed)
Pt states he forgot to bring his medications and is unsure of what he takes. Reports he knows he takes Coumadin, Insulin, Metformin, Glipizide and 2 "fluid pills."

## 2015-09-18 NOTE — ED Notes (Addendum)
Pt reports he fell around 2330 last night. Pt ambulates with a cane and states he was walking down the hall when he fell (pt reports his R knee is weak and gives out on him sometimes). Pt reports landing on tailbone and trying to catch his fall with his L hand (swelling noted). Swelling noted to R thumb as well. Pt reports hitting head on wall after the fall (reports he takes Coumadin). Denies LOC, n/v, dizziness, blurry vision, other neuro symptoms. Pt has small bump on L side of head. Pt also reports L hip pain.

## 2015-09-23 DIAGNOSIS — J449 Chronic obstructive pulmonary disease, unspecified: Secondary | ICD-10-CM | POA: Insufficient documentation

## 2015-12-13 DIAGNOSIS — K5901 Slow transit constipation: Secondary | ICD-10-CM | POA: Insufficient documentation

## 2016-04-28 DIAGNOSIS — R296 Repeated falls: Secondary | ICD-10-CM | POA: Insufficient documentation

## 2016-04-28 DIAGNOSIS — E1140 Type 2 diabetes mellitus with diabetic neuropathy, unspecified: Secondary | ICD-10-CM | POA: Insufficient documentation

## 2016-04-28 DIAGNOSIS — E1149 Type 2 diabetes mellitus with other diabetic neurological complication: Secondary | ICD-10-CM

## 2016-04-28 DIAGNOSIS — Z9989 Dependence on other enabling machines and devices: Secondary | ICD-10-CM | POA: Insufficient documentation

## 2016-04-28 DIAGNOSIS — R262 Difficulty in walking, not elsewhere classified: Secondary | ICD-10-CM | POA: Insufficient documentation

## 2016-05-30 DIAGNOSIS — Z96651 Presence of right artificial knee joint: Secondary | ICD-10-CM | POA: Insufficient documentation

## 2016-06-28 DIAGNOSIS — L298 Other pruritus: Secondary | ICD-10-CM | POA: Insufficient documentation

## 2016-07-17 DIAGNOSIS — M47816 Spondylosis without myelopathy or radiculopathy, lumbar region: Secondary | ICD-10-CM | POA: Insufficient documentation

## 2016-07-17 DIAGNOSIS — M533 Sacrococcygeal disorders, not elsewhere classified: Secondary | ICD-10-CM | POA: Insufficient documentation

## 2016-07-22 DIAGNOSIS — M2170 Unequal limb length (acquired), unspecified site: Secondary | ICD-10-CM | POA: Insufficient documentation

## 2016-08-18 DIAGNOSIS — R32 Unspecified urinary incontinence: Secondary | ICD-10-CM | POA: Insufficient documentation

## 2016-08-18 DIAGNOSIS — Z8546 Personal history of malignant neoplasm of prostate: Secondary | ICD-10-CM | POA: Insufficient documentation

## 2016-09-20 DIAGNOSIS — Z9114 Patient's other noncompliance with medication regimen: Secondary | ICD-10-CM | POA: Insufficient documentation

## 2016-09-20 DIAGNOSIS — Z91148 Patient's other noncompliance with medication regimen for other reason: Secondary | ICD-10-CM | POA: Insufficient documentation

## 2016-09-20 DIAGNOSIS — H2010 Chronic iridocyclitis, unspecified eye: Secondary | ICD-10-CM | POA: Insufficient documentation

## 2016-09-20 DIAGNOSIS — I509 Heart failure, unspecified: Secondary | ICD-10-CM | POA: Insufficient documentation

## 2016-12-11 DIAGNOSIS — I160 Hypertensive urgency: Secondary | ICD-10-CM | POA: Insufficient documentation

## 2016-12-11 DIAGNOSIS — R945 Abnormal results of liver function studies: Secondary | ICD-10-CM

## 2016-12-11 DIAGNOSIS — R7989 Other specified abnormal findings of blood chemistry: Secondary | ICD-10-CM | POA: Insufficient documentation

## 2016-12-11 DIAGNOSIS — I5033 Acute on chronic diastolic (congestive) heart failure: Secondary | ICD-10-CM | POA: Insufficient documentation

## 2016-12-28 DIAGNOSIS — Z9119 Patient's noncompliance with other medical treatment and regimen: Secondary | ICD-10-CM | POA: Insufficient documentation

## 2016-12-28 DIAGNOSIS — Z91199 Patient's noncompliance with other medical treatment and regimen due to unspecified reason: Secondary | ICD-10-CM | POA: Insufficient documentation

## 2017-05-15 DIAGNOSIS — IMO0001 Reserved for inherently not codable concepts without codable children: Secondary | ICD-10-CM | POA: Insufficient documentation

## 2017-05-15 DIAGNOSIS — H9041 Sensorineural hearing loss, unilateral, right ear, with unrestricted hearing on the contralateral side: Secondary | ICD-10-CM | POA: Insufficient documentation

## 2017-06-12 DIAGNOSIS — Z1211 Encounter for screening for malignant neoplasm of colon: Secondary | ICD-10-CM | POA: Insufficient documentation

## 2017-06-12 DIAGNOSIS — R1310 Dysphagia, unspecified: Secondary | ICD-10-CM | POA: Insufficient documentation

## 2017-06-26 IMAGING — DX DG LUMBAR SPINE COMPLETE 4+V
5 series · 5 of 5 positions shown · non-contrast
Comparison: None.

CLINICAL DATA: Fall.  Low back pain.

EXAM:
LUMBAR SPINE - COMPLETE 4+ VIEW

[l-spine ap]
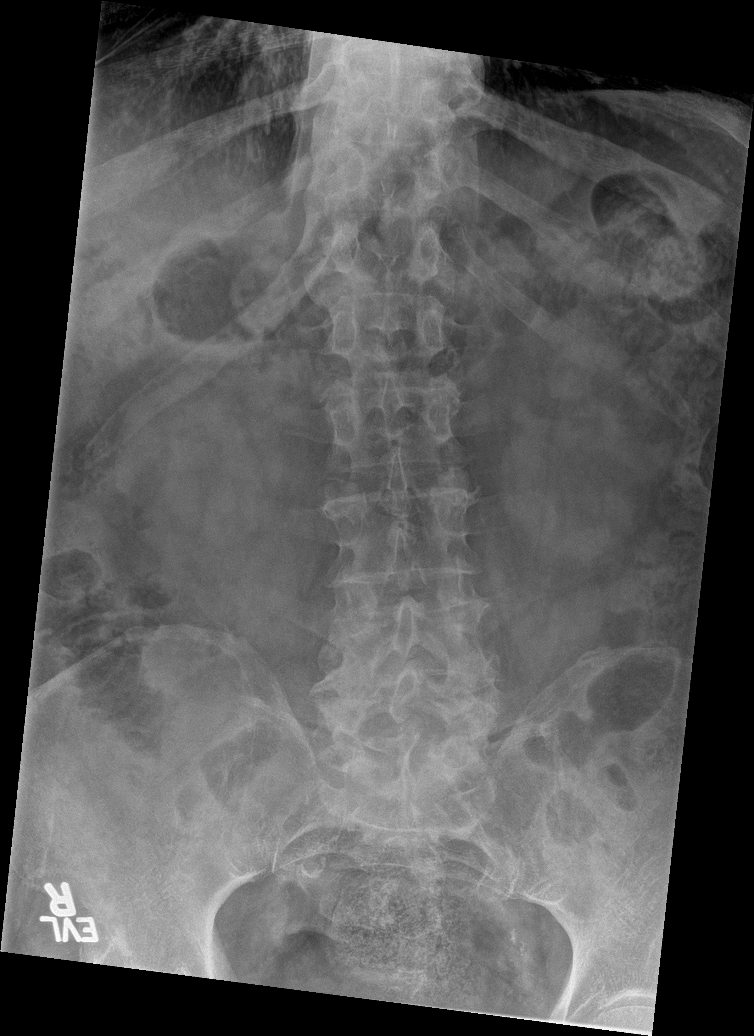

[l-spine obl (1 of 2)]
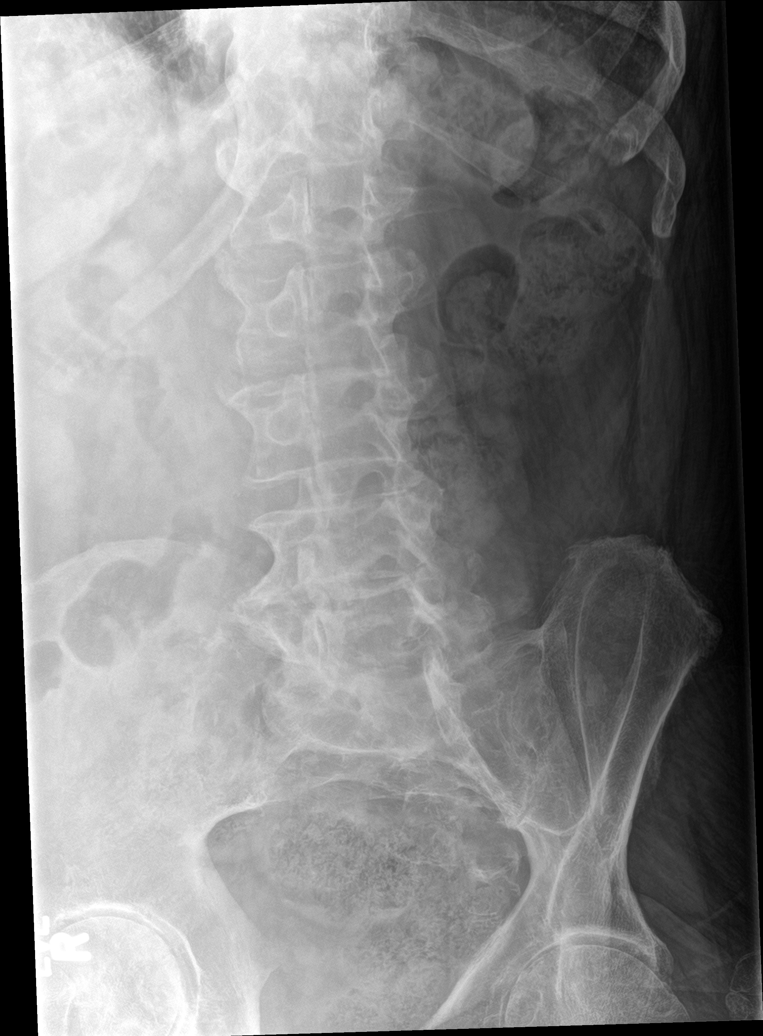

[l-spine obl (2 of 2)]
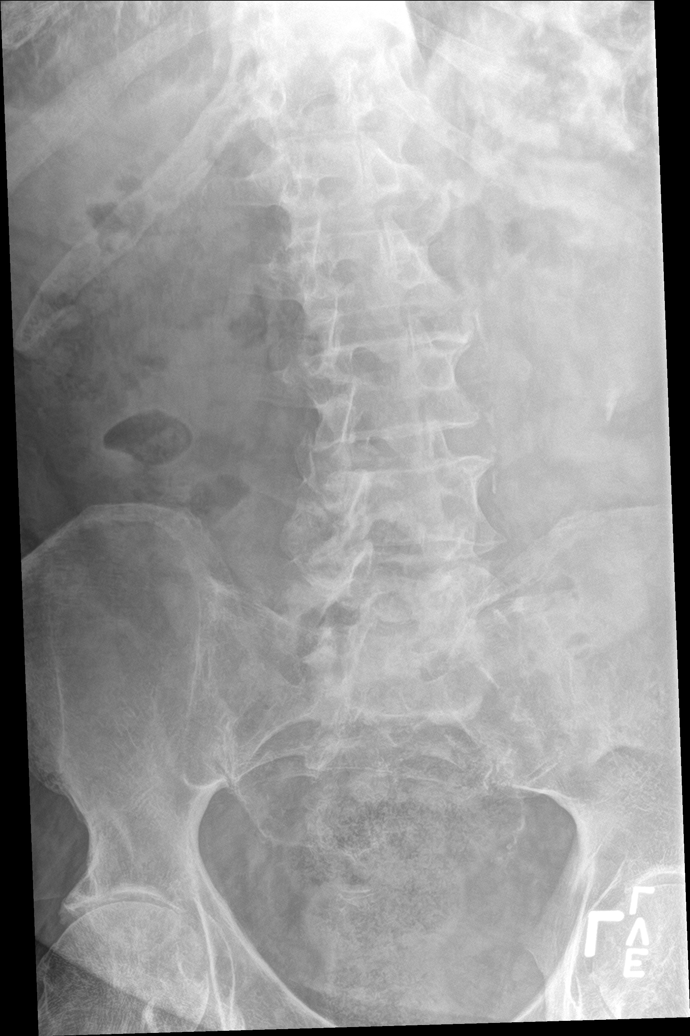

[l-spine lat]
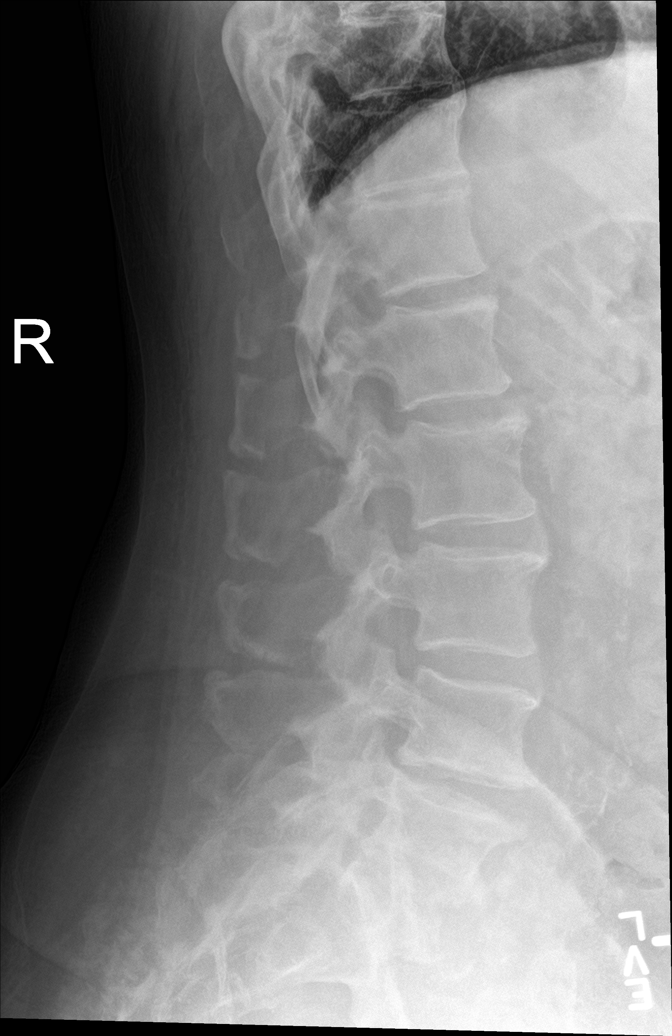

[l-spine spot]
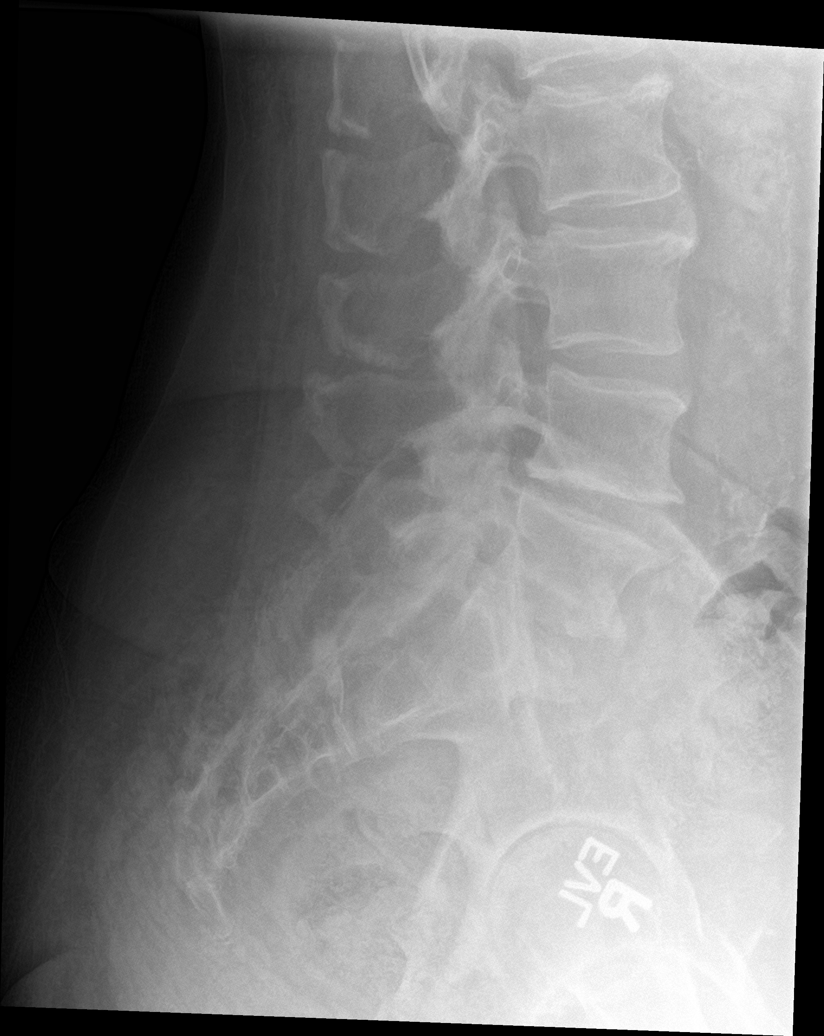

[5 of 5 positions shown; findings below may reference images not displayed]

FINDINGS: This report assumes 5 non rib-bearing lumbar vertebrae.

Lumbar vertebral body heights are preserved, with no fracture.

Moderate spondylosis throughout the lumbar spine with loss of disc
height at L5-S1. No spondylolisthesis. Facet arthropathy bilaterally
in the lower lumbar spine. No aggressive appearing focal osseous
lesions.
IMPRESSION: 1. No lumbar spine fracture or spondylolisthesis .
2. Moderate spondylosis.

## 2017-08-02 DIAGNOSIS — H35371 Puckering of macula, right eye: Secondary | ICD-10-CM | POA: Insufficient documentation

## 2017-08-14 ENCOUNTER — Other Ambulatory Visit: Payer: Self-pay | Admitting: Gerontology

## 2017-08-14 ENCOUNTER — Ambulatory Visit
Admission: RE | Admit: 2017-08-14 | Discharge: 2017-08-14 | Disposition: A | Discharge status: Home / Self Care | Payer: No Typology Code available for payment source | Source: Ambulatory Visit | Attending: Gerontology | Admitting: Gerontology

## 2017-08-14 DIAGNOSIS — R0989 Other specified symptoms and signs involving the circulatory and respiratory systems: Secondary | ICD-10-CM

## 2017-09-20 ENCOUNTER — Inpatient Hospital Stay: Payer: Medicare (Managed Care) | Attending: Hematology & Oncology | Admitting: Hematology & Oncology

## 2017-09-20 ENCOUNTER — Other Ambulatory Visit: Payer: Self-pay

## 2017-09-20 ENCOUNTER — Inpatient Hospital Stay: Payer: Medicare (Managed Care)

## 2017-09-20 ENCOUNTER — Encounter: Payer: Self-pay | Admitting: Hematology & Oncology

## 2017-09-20 VITALS — BP 131/61 | HR 72 | Temp 98.2°F | Resp 18

## 2017-09-20 DIAGNOSIS — Z794 Long term (current) use of insulin: Secondary | ICD-10-CM

## 2017-09-20 DIAGNOSIS — G8929 Other chronic pain: Secondary | ICD-10-CM | POA: Diagnosis not present

## 2017-09-20 DIAGNOSIS — G4730 Sleep apnea, unspecified: Secondary | ICD-10-CM | POA: Insufficient documentation

## 2017-09-20 DIAGNOSIS — E0800 Diabetes mellitus due to underlying condition with hyperosmolarity without nonketotic hyperglycemic-hyperosmolar coma (NKHHC): Secondary | ICD-10-CM

## 2017-09-20 DIAGNOSIS — E785 Hyperlipidemia, unspecified: Secondary | ICD-10-CM | POA: Insufficient documentation

## 2017-09-20 DIAGNOSIS — E119 Type 2 diabetes mellitus without complications: Secondary | ICD-10-CM | POA: Diagnosis not present

## 2017-09-20 DIAGNOSIS — Z8546 Personal history of malignant neoplasm of prostate: Secondary | ICD-10-CM

## 2017-09-20 DIAGNOSIS — C61 Malignant neoplasm of prostate: Secondary | ICD-10-CM | POA: Insufficient documentation

## 2017-09-20 DIAGNOSIS — N529 Male erectile dysfunction, unspecified: Secondary | ICD-10-CM | POA: Diagnosis not present

## 2017-09-20 DIAGNOSIS — Z87891 Personal history of nicotine dependence: Secondary | ICD-10-CM | POA: Insufficient documentation

## 2017-09-20 DIAGNOSIS — Z86711 Personal history of pulmonary embolism: Secondary | ICD-10-CM | POA: Diagnosis not present

## 2017-09-20 NOTE — Progress Notes (Signed)
Referral MD  Reason for Referral: History of pulmonary embolism  Chief Complaint  Patient presents with  . New Patient (Initial Visit)  : I have no idea why I am here.  HPI: Joseph Kaiser is a really nice 77 year old African-American male.  He has a very interesting past history.  His history is quite extensive.  He has diabetes.  He is insulin dependent.  He has had multiple multiple surgeries.  He is on multiple medications.  He has chronic pain problems from his surgeries.  He apparently has a history of prostate cancer.  I really do not have any further history regarding this.  He apparently had a blood clot in the lung back in April 2016.  Again I do not have any scans that show that there is a blood clot.  I have looked back at CT angiograms of the chest dating back in 2010.  I cannot find a CT angiogram that shows that he has had a blood clot.  I looked at Doppler exams of his leg.  I cannot find any evidence of thromboembolic disease in his legs.  He said that he was on Coumadin.  He was on Coumadin for 6 months.  He was then taken off Coumadin.  He was then referred to the [facility] for evaluation to see if he needed long-term anticoagulation.  His main complaint is that he is having a lot of pain.  He cannot figure out why he was taken off his Percocet.  He said this was working quite well for him.  He also is worried about being impotent.  He would like to have something done to help with this.  He used to smoke.  He probably has close to a 60-pack-year history of tobacco use.  He stopped several years ago.  He used to drink hard liquor.  In fact, he said he would make his own hard liquor with raisins and sugar and yeast.  This is absolutely fascinating.  He has had no bleeding.  He comes in a motorized wheelchair.  He says he really cannot walk because of pain.  Overall, I said his performance status is ECOG 3.   Past Medical History:  Diagnosis  Date  . Asthma   . Boils   . COPD (chronic obstructive pulmonary disease) ([place])   . Diabetes mellitus without complication ([place])   . Hypertension   . Myocardial infarction ([place]) 2016  . Pulmonary embolism ([place]) 2016  . Wrist fracture, left 2017  :  Past Surgical History:  Procedure Laterality Date  . JOINT REPLACEMENT    :   Current Outpatient Medications:  .  albuterol (PROVENTIL HFA) 108 (90 Base) MCG/ACT inhaler, Inhale into the lungs., Disp: , Rfl:  .  albuterol (PROVENTIL) (2.5 MG/3ML) 0.083% nebulizer solution, Take 2.5 mg by nebulization 2 (two) times daily as needed., Disp: , Rfl:  .  allopurinol (ZYLOPRIM) 100 MG tablet, Take 100 mg by mouth 2 (two) times daily. Take 1 tab twice daily, Disp: , Rfl:  .  amLODipine (NORVASC) 10 MG tablet, Take 10 mg by mouth daily., Disp: , Rfl:  .  atorvastatin (LIPITOR) 80 MG tablet, Take 80 mg by mouth daily., Disp: , Rfl:  .  diphenhydrAMINE-zinc acetate (BENADRYL) cream, Apply topically 3 (three) times daily as needed for itching. QID PRN topically to areas of itchy rash ( feet, abdomen and arms), Disp: , Rfl:  .  DULoxetine (CYMBALTA) 60 MG capsule, Take 60 mg by  mouth daily., Disp: , Rfl:  .  ferrous gluconate (FERGON) 324 MG tablet, Take 324 mg by mouth daily., Disp: , Rfl:  .  furosemide (LASIX) 40 MG tablet, Take 40 mg by mouth 2 (two) times daily., Disp: , Rfl:  .  gabapentin (NEURONTIN) 300 MG capsule, Take 300 mg by mouth 2 (two) times daily., Disp: , Rfl:  .  insulin aspart (NOVOLOG) 100 UNIT/ML FlexPen, Inject 12 Units into the skin at bedtime., Disp: , Rfl:  .  isosorbide mononitrate (IMDUR) 30 MG 24 hr tablet, Take 30 mg by mouth daily., Disp: , Rfl:  .  ketoconazole (NIZORAL) 2 % cream, APPLY TO AFFECTED AREA EVERY DAY, Disp: , Rfl:  .  losartan (COZAAR) 100 MG tablet, Take 100 mg by mouth daily., Disp: , Rfl:  .  Menthol, Topical Analgesic, (ICY HOT PAIN RELIEVING EX), Apply topically 3 (three) times daily as needed., Disp: ,  Rfl:  .  metoprolol tartrate (LOPRESSOR) 100 MG tablet, Take 100 mg by mouth 2 (two) times daily., Disp: , Rfl:  .  triamcinolone cream (KENALOG) 0.1 %, Apply 1 application topically 3 (three) times daily. One application topically TID to arms/abdomen rash, Disp: , Rfl:  .  Vitamin D, Ergocalciferol, (DRISDOL) 50000 units CAPS capsule, Take 2,000 Units by mouth daily., Disp: , Rfl:  .  acetaminophen (TYLENOL) 500 MG tablet, Take 500 mg by mouth. Take two tabs total 1000 mg Q8H PRN for pain., Disp: , Rfl:  .  aspirin EC 81 MG tablet, Take 81 mg by mouth daily. One tab orally once a day cardiac event prevention., Disp: , Rfl:  .  Carboxymethylcellulose Sod PF 0.25 % SOLN, Place 1 drop into both eyes daily., Disp: , Rfl:  .  Fluticasone-Umeclidin-Vilant 100-62.5-25 MCG/INH AEPB, Inhale into the lungs., Disp: , Rfl:  .  ketotifen (ZADITOR) 0.025 % ophthalmic solution, Place 1 drop into both eyes 2 (two) times daily., Disp: , Rfl:  .  magnesium hydroxide (MILK OF MAGNESIA) 400 MG/5ML suspension, Take 15 mLs by mouth at bedtime., Disp: , Rfl:  .  Melatonin 5 MG CAPS, Take 10 mg by mouth daily., Disp: , Rfl:  .  metFORMIN (GLUCOPHAGE-XR) 750 MG 24 hr tablet, Take 750 mg by mouth daily., Disp: , Rfl:  .  naproxen sodium (ALEVE) 220 MG tablet, Take 220 mg by mouth. Take two tabs orrally as needed for pain. Alternate with tylenol., Disp: , Rfl:  .  pantoprazole (PROTONIX) 40 MG tablet, Take 40 mg by mouth daily., Disp: , Rfl: :  :  Allergies  Allergen Reactions  . Tape Hives  . Iodinated Diagnostic Agents Other (See Comments)    Burning of the skin.   . Other Hives, Other (See Comments) and Rash    "pulls skin off"  :  History reviewed. No pertinent family history.:  Social History   Socioeconomic History  . Marital status: Widowed    Spouse name: Not on file  . Number of children: Not on file  . Years of education: Not on file  . Highest education level: Not on file  Occupational History   . Not on file  Social Needs  . Financial resource strain: Not on file  . Food insecurity:    Worry: Not on file    Inability: Not on file  . Transportation needs:    Medical: Not on file    Non-medical: Not on file  Tobacco Use  . Smoking status: Former Research scientist (life sciences)  . Smokeless tobacco:  Never Used  Substance and Sexual Activity  . Alcohol use: Yes    Alcohol/week: 1.2 oz    Types: 2 Cans of beer per week  . Drug use: No  . Sexual activity: Not on file  Lifestyle  . Physical activity:    Days per week: Not on file    Minutes per session: Not on file  . Stress: Not on file  Relationships  . Social connections:    Talks on phone: Not on file    Gets together: Not on file    Attends religious service: Not on file    Active member of club or organization: Not on file    Attends meetings of clubs or organizations: Not on file    Relationship status: Not on file  . Intimate partner violence:    Fear of current or ex partner: Not on file    Emotionally abused: Not on file    Physically abused: Not on file    Forced sexual activity: Not on file  Other Topics Concern  . Not on file  Social History Narrative  . Not on file  :  Review of Systems  Constitutional: Positive for malaise/fatigue.  HENT: Positive for ear pain and sinus pain.   Eyes: Positive for blurred vision and pain.  Respiratory: Positive for cough, sputum production and shortness of breath.   Cardiovascular: Positive for palpitations, claudication and leg swelling.  Gastrointestinal: Positive for abdominal pain, constipation, heartburn and nausea.  Genitourinary: Positive for frequency.  Musculoskeletal: Positive for joint pain and myalgias.  Neurological: Positive for dizziness and tingling.  Endo/Heme/Allergies: Negative.      Exam: Obese African-American male in no obvious distress.  Vital signs show temperature of 98.2.  Pulse 72.  Blood pressure 131/61.  Weight is not taken.  Head neck exam shows no  ocular or oral lesions.  He has no adenopathy in the neck.  Lungs are clear bilaterally.  There may be some slight decrease at the bases.  Cardiac exam regular rate and rhythm with no murmurs, rubs or bruits.  Abdomen is obese.  Abdomen is soft.  Bowel sounds are decent.  Extremities shows his left knee To be missing.  He has multiple surgical scars on his knees.  He has some mild chronic edema in his legs.  Skin exam shows no rashes, ecchymoses or petechia.  Neurological exam shows no obvious neurological deficits. @IPVITALS @   No results for input(s): WBC, HGB, HCT, PLT in the last 72 hours. No results for input(s): NA, K, CL, CO2, GLUCOSE, BUN, CREATININE, CALCIUM in the last 72 hours.  Blood smear review: None  Pathology: None    Assessment and Plan: Joseph Kaiser is a nice 77 year old African-American male.  He has multiple health issues.  Again, I cannot find any definite evidence of him having a pulmonary embolism.  I went through the epic system.  I try to look at all the CT angiograms that he has had done.  He has had quite a few ton.  He has had only one episode of a thromboembolic event.  As such, he does not need lifelong anticoagulation.  I suppose he is at a higher risk for blood clots because he is not that mobile.  He has diabetes which I am sure is not that well controlled.  His main concerns are not at all hematologic.  His main concern really is urologic.  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  I will  refer him to urology and we will see if they can help out.  Again, I do not see that he needs a hypercoagulable work-up.  I spent about an hour with him.  I spent over half the time with him face to face.  I explained to him what can be done to help minimize the risk of blood clots.  Staying well hydrated is the most important.  Trying to be active is also important.  Not smoking is helpful.  Not taking androgens also would be a good idea.  He is actually  quite funny.  I really do not think we have to see him back in the clinic.

## 2017-11-07 ENCOUNTER — Other Ambulatory Visit: Payer: Self-pay | Admitting: Gerontology

## 2017-11-07 ENCOUNTER — Ambulatory Visit
Admission: RE | Admit: 2017-11-07 | Discharge: 2017-11-07 | Disposition: A | Discharge status: Home / Self Care | Payer: Medicare (Managed Care) | Source: Ambulatory Visit | Attending: Gerontology | Admitting: Gerontology

## 2017-11-07 DIAGNOSIS — M5441 Lumbago with sciatica, right side: Secondary | ICD-10-CM

## 2018-01-15 ENCOUNTER — Ambulatory Visit
Admission: RE | Admit: 2018-01-15 | Discharge: 2018-01-15 | Disposition: A | Discharge status: Home / Self Care | Payer: Medicare (Managed Care) | Source: Ambulatory Visit | Attending: Gerontology | Admitting: Gerontology

## 2018-01-15 ENCOUNTER — Other Ambulatory Visit: Payer: Self-pay | Admitting: Gerontology

## 2018-01-15 DIAGNOSIS — W19XXXA Unspecified fall, initial encounter: Secondary | ICD-10-CM

## 2018-03-01 ENCOUNTER — Ambulatory Visit (HOSPITAL_COMMUNITY)
Admission: RE | Admit: 2018-03-01 | Discharge: 2018-03-01 | Disposition: A | Discharge status: Home / Self Care | Payer: Medicare (Managed Care) | Source: Ambulatory Visit | Attending: Internal Medicine | Admitting: Internal Medicine

## 2018-03-01 ENCOUNTER — Other Ambulatory Visit (HOSPITAL_COMMUNITY): Payer: Self-pay | Admitting: Internal Medicine

## 2018-03-01 DIAGNOSIS — G8929 Other chronic pain: Secondary | ICD-10-CM | POA: Diagnosis present

## 2018-04-01 ENCOUNTER — Other Ambulatory Visit (HOSPITAL_COMMUNITY): Payer: Self-pay | Admitting: Internal Medicine

## 2018-04-01 DIAGNOSIS — E041 Nontoxic single thyroid nodule: Secondary | ICD-10-CM

## 2018-04-04 ENCOUNTER — Other Ambulatory Visit: Payer: Self-pay | Admitting: Family Medicine

## 2018-04-04 ENCOUNTER — Ambulatory Visit
Admission: RE | Admit: 2018-04-04 | Discharge: 2018-04-04 | Disposition: A | Discharge status: Home / Self Care | Payer: Medicare (Managed Care) | Source: Ambulatory Visit | Attending: Family Medicine | Admitting: Family Medicine

## 2018-04-04 DIAGNOSIS — M25521 Pain in right elbow: Secondary | ICD-10-CM

## 2018-04-09 ENCOUNTER — Ambulatory Visit (HOSPITAL_COMMUNITY)
Admission: RE | Admit: 2018-04-09 | Discharge: 2018-04-09 | Disposition: A | Discharge status: Home / Self Care | Payer: Medicare (Managed Care) | Source: Ambulatory Visit | Attending: Internal Medicine | Admitting: Internal Medicine

## 2018-04-09 DIAGNOSIS — E041 Nontoxic single thyroid nodule: Secondary | ICD-10-CM | POA: Diagnosis not present

## 2018-04-25 ENCOUNTER — Other Ambulatory Visit: Payer: Self-pay | Admitting: Family Medicine

## 2018-04-25 DIAGNOSIS — G894 Chronic pain syndrome: Secondary | ICD-10-CM

## 2018-05-07 ENCOUNTER — Ambulatory Visit
Admission: RE | Admit: 2018-05-07 | Discharge: 2018-05-07 | Disposition: A | Discharge status: Home / Self Care | Payer: Medicare (Managed Care) | Source: Ambulatory Visit | Attending: Family Medicine | Admitting: Family Medicine

## 2018-05-07 DIAGNOSIS — G894 Chronic pain syndrome: Secondary | ICD-10-CM

## 2018-05-07 MED ORDER — GADOBENATE DIMEGLUMINE 529 MG/ML IV SOLN
20.0000 mL | Freq: Once | INTRAVENOUS | Status: AC | PRN
  Administered 2018-05-07: 20 mL via INTRAVENOUS

## 2018-07-11 ENCOUNTER — Other Ambulatory Visit: Payer: Self-pay | Admitting: Internal Medicine

## 2018-07-11 ENCOUNTER — Ambulatory Visit
Admission: RE | Admit: 2018-07-11 | Discharge: 2018-07-11 | Disposition: A | Discharge status: Home / Self Care | Payer: Medicare (Managed Care) | Source: Ambulatory Visit | Attending: Internal Medicine | Admitting: Internal Medicine

## 2018-07-11 DIAGNOSIS — R0782 Intercostal pain: Secondary | ICD-10-CM

## 2018-07-11 DIAGNOSIS — R52 Pain, unspecified: Secondary | ICD-10-CM

## 2019-08-02 DEATH — deceased
